# Patient Record
Sex: Female | Born: 1965
Health system: Southern US, Community
[De-identification: ages and names within clinical notes are randomized; demographics above are authoritative.]

## PROBLEM LIST (undated history)

## (undated) DIAGNOSIS — D649 Anemia, unspecified: Secondary | ICD-10-CM

## (undated) DIAGNOSIS — D573 Sickle-cell trait: Secondary | ICD-10-CM

---

## 2004-04-26 ENCOUNTER — Other Ambulatory Visit: Admission: RE | Admit: 2004-04-26 | Discharge: 2004-04-26 | Payer: Self-pay | Admitting: Family Medicine

## 2006-06-03 ENCOUNTER — Other Ambulatory Visit: Admission: RE | Admit: 2006-06-03 | Discharge: 2006-06-03 | Payer: Self-pay | Admitting: Family Medicine

## 2008-12-02 ENCOUNTER — Other Ambulatory Visit: Admission: RE | Admit: 2008-12-02 | Discharge: 2008-12-02 | Payer: Self-pay | Admitting: Family Medicine

## 2009-01-06 ENCOUNTER — Encounter: Admission: RE | Admit: 2009-01-06 | Discharge: 2009-01-06 | Payer: Self-pay | Admitting: Family Medicine

## 2010-02-06 ENCOUNTER — Encounter: Admission: RE | Admit: 2010-02-06 | Discharge: 2010-02-06 | Payer: Self-pay | Admitting: Family Medicine

## 2010-11-11 ENCOUNTER — Encounter: Payer: Self-pay | Admitting: Family Medicine

## 2012-09-29 ENCOUNTER — Other Ambulatory Visit: Payer: Self-pay | Admitting: Family Medicine

## 2012-09-29 DIAGNOSIS — Z1231 Encounter for screening mammogram for malignant neoplasm of breast: Secondary | ICD-10-CM

## 2012-11-03 ENCOUNTER — Ambulatory Visit
Admission: RE | Admit: 2012-11-03 | Discharge: 2012-11-03 | Disposition: A | Discharge status: Home / Self Care | Payer: 59 | Source: Ambulatory Visit | Attending: Family Medicine | Admitting: Family Medicine

## 2012-11-03 DIAGNOSIS — Z1231 Encounter for screening mammogram for malignant neoplasm of breast: Secondary | ICD-10-CM

## 2014-11-03 ENCOUNTER — Other Ambulatory Visit: Payer: Self-pay | Admitting: Physician Assistant

## 2014-11-03 ENCOUNTER — Other Ambulatory Visit (HOSPITAL_COMMUNITY)
Admission: RE | Admit: 2014-11-03 | Discharge: 2014-11-03 | Disposition: A | Discharge status: Home / Self Care | Payer: 59 | Source: Ambulatory Visit | Attending: Family Medicine | Admitting: Family Medicine

## 2014-11-03 DIAGNOSIS — Z01419 Encounter for gynecological examination (general) (routine) without abnormal findings: Secondary | ICD-10-CM | POA: Insufficient documentation

## 2014-11-03 DIAGNOSIS — Z1151 Encounter for screening for human papillomavirus (HPV): Secondary | ICD-10-CM | POA: Insufficient documentation

## 2014-11-05 ENCOUNTER — Emergency Department (HOSPITAL_COMMUNITY)
Admission: EM | Admit: 2014-11-05 | Discharge: 2014-11-05 | Disposition: A | Discharge status: Home / Self Care | Payer: 59 | Attending: Emergency Medicine | Admitting: Emergency Medicine

## 2014-11-05 ENCOUNTER — Encounter (HOSPITAL_COMMUNITY): Payer: Self-pay | Admitting: Emergency Medicine

## 2014-11-05 DIAGNOSIS — Z79899 Other long term (current) drug therapy: Secondary | ICD-10-CM | POA: Diagnosis not present

## 2014-11-05 DIAGNOSIS — D649 Anemia, unspecified: Secondary | ICD-10-CM | POA: Insufficient documentation

## 2014-11-05 DIAGNOSIS — R7989 Other specified abnormal findings of blood chemistry: Secondary | ICD-10-CM | POA: Diagnosis present

## 2014-11-05 HISTORY — DX: Anemia, unspecified: D64.9

## 2014-11-05 HISTORY — DX: Sickle-cell trait: D57.3

## 2014-11-05 LAB — I-STAT CHEM 8, ED
BUN: 8 mg/dL (ref 6–23)
Calcium, Ion: 1.3 mmol/L — ABNORMAL HIGH (ref 1.12–1.23)
Chloride: 105 mEq/L (ref 96–112)
Creatinine, Ser: 0.6 mg/dL (ref 0.50–1.10)
GLUCOSE: 89 mg/dL (ref 70–99)
HCT: 24 % — ABNORMAL LOW (ref 36.0–46.0)
HEMOGLOBIN: 8.2 g/dL — AB (ref 12.0–15.0)
Potassium: 3.8 mmol/L (ref 3.5–5.1)
Sodium: 141 mmol/L (ref 135–145)
TCO2: 22 mmol/L (ref 0–100)

## 2014-11-05 LAB — CBC WITH DIFFERENTIAL/PLATELET
BASOS PCT: 0 % (ref 0–1)
Basophils Absolute: 0 10*3/uL (ref 0.0–0.1)
EOS ABS: 0.1 10*3/uL (ref 0.0–0.7)
EOS PCT: 1 % (ref 0–5)
HEMATOCRIT: 23.6 % — AB (ref 36.0–46.0)
HEMOGLOBIN: 6.4 g/dL — AB (ref 12.0–15.0)
LYMPHS ABS: 2.5 10*3/uL (ref 0.7–4.0)
Lymphocytes Relative: 46 % (ref 12–46)
MCH: 16.5 pg — AB (ref 26.0–34.0)
MCHC: 27.1 g/dL — AB (ref 30.0–36.0)
MCV: 60.8 fL — AB (ref 78.0–100.0)
MONO ABS: 0.3 10*3/uL (ref 0.1–1.0)
Monocytes Relative: 6 % (ref 3–12)
NEUTROS ABS: 2.5 10*3/uL (ref 1.7–7.7)
Neutrophils Relative %: 47 % (ref 43–77)
PLATELETS: 364 10*3/uL (ref 150–400)
RBC: 3.88 MIL/uL (ref 3.87–5.11)
RDW: 20.1 % — AB (ref 11.5–15.5)
WBC: 5.4 10*3/uL (ref 4.0–10.5)

## 2014-11-05 LAB — IRON AND TIBC
IRON: 17 ug/dL — AB (ref 42–145)
Saturation Ratios: 3 % — ABNORMAL LOW (ref 20–55)
TIBC: 494 ug/dL — ABNORMAL HIGH (ref 250–470)
UIBC: 477 ug/dL — ABNORMAL HIGH (ref 125–400)

## 2014-11-05 LAB — ABO/RH: ABO/RH(D): O POS

## 2014-11-05 LAB — RETICULOCYTES
RBC.: 3.88 MIL/uL (ref 3.87–5.11)
RETIC COUNT ABSOLUTE: 38.8 10*3/uL (ref 19.0–186.0)
Retic Ct Pct: 1 % (ref 0.4–3.1)

## 2014-11-05 LAB — PREPARE RBC (CROSSMATCH)

## 2014-11-05 LAB — FERRITIN: Ferritin: 3 ng/mL — ABNORMAL LOW (ref 10–291)

## 2014-11-05 LAB — FOLATE

## 2014-11-05 LAB — VITAMIN B12: VITAMIN B 12: 573 pg/mL (ref 211–911)

## 2014-11-05 LAB — POC OCCULT BLOOD, ED: Fecal Occult Bld: NEGATIVE

## 2014-11-05 MED ORDER — SODIUM CHLORIDE 0.9 % IV SOLN
Freq: Once | INTRAVENOUS | Status: AC
  Administered 2014-11-05: 11:00:00 via INTRAVENOUS

## 2014-11-05 NOTE — ED Provider Notes (Signed)
CSN: [id number]     Arrival date & time 11/05/14  0808 History   First MD Initiated Contact with Patient 11/05/14 [id number]     Chief Complaint  Patient presents with  . Anemia  . Abnormal labs    Patient is a 49 y.o. female presenting with anemia.  Anemia   Pt has history of anemia.  She saw her doctor this week and had routine blood tests on Thursday.  Last evening she was called and was told her blood count was low.  It was 6.5.  She was told to come to the ED to get a transfusion.  She denies any complaints.  No blood in the stool.  No history of heavy menses.  No menses currently.  No shortness of breath or lightheadedness.  She is supposed to be on iron but has been taking OTC meds irregularly.  Pt has never seen a hematologist.  She has been diagnosed with iron deficiency anemia in the past by other PCPs.  The doctor she saw this week is new. Past Medical History  Diagnosis Date  . Anemia   . Sickle cell trait    History reviewed. No pertinent past surgical history. No family history on file. History  Substance Use Topics  . Smoking status: Never Smoker   . Smokeless tobacco: Not on file  . Alcohol Use: Yes     Comment: social   OB History    No data available     Review of Systems  All other systems reviewed and are negative.     Allergies  Sunflower oil  Home Medications   Prior to Admission medications   Medication Sig Start Date End Date Taking? Authorizing Provider  ferrous sulfate 325 (65 FE) MG tablet Take 325 mg by mouth daily with breakfast.   Yes Historical Provider, MD  Vitamin D, Ergocalciferol, (DRISDOL) 50000 UNITS CAPS capsule Take 50,000 Units by mouth every 7 (seven) days.   Yes Historical Provider, MD   BP 156/70 mmHg  Pulse 66  Temp(Src) 98.4 F (36.9 C) (Oral)  Resp 20  SpO2 100%  LMP 10/26/2014 Physical Exam  Constitutional: She appears well-developed and well-nourished. No distress.  HENT:  Head: Normocephalic and atraumatic.  Right  Ear: External ear normal.  Left Ear: External ear normal.  Eyes: Conjunctivae are normal. Right eye exhibits no discharge. Left eye exhibits no discharge. No scleral icterus.  Neck: Neck supple. No tracheal deviation present.  Cardiovascular: Normal rate, regular rhythm and intact distal pulses.   Pulmonary/Chest: Effort normal and breath sounds normal. No stridor. No respiratory distress. She has no wheezes. She has no rales.  Abdominal: Soft. Bowel sounds are normal. She exhibits no distension. There is no tenderness. There is no rebound and no guarding.  Musculoskeletal: She exhibits no edema or tenderness.  Neurological: She is alert. She has normal strength. No cranial nerve deficit (no facial droop, extraocular movements intact, no slurred speech) or sensory deficit. She exhibits normal muscle tone. She displays no seizure activity. Coordination normal.  Skin: Skin is warm and dry. No rash noted.  Psychiatric: She has a normal mood and affect.  Nursing note and vitals reviewed.   ED Course  Procedures (including critical care time) Labs Review Labs Reviewed  CBC WITH DIFFERENTIAL - Abnormal; Notable for the following:    Hemoglobin 6.4 (*)    HCT 23.6 (*)    MCV 60.8 (*)    MCH 16.5 (*)    MCHC 27.1 (*)  RDW 20.1 (*)    All other components within normal limits  I-STAT CHEM 8, ED - Abnormal; Notable for the following:    Calcium, Ion 1.30 (*)    Hemoglobin 8.2 (*)    HCT 24.0 (*)    All other components within normal limits  RETICULOCYTES  VITAMIN B12  FOLATE  IRON AND TIBC  FERRITIN  POC OCCULT BLOOD, ED  TYPE AND SCREEN  ABO/RH  PREPARE RBC (CROSSMATCH)    Medications  0.9 %  sodium chloride infusion ( Intravenous New Bag/Given 11/05/14 1045)     MDM   Final diagnoses:  Anemia, unspecified anemia type    The patient does not appear to have any evidence of active bleeding.  No blood in her stool. She denies any vaginal bleeding. She has a microcytic  anemia, most likely iron deficiency. The patient was given a unit of blood in the emergency department. An anemia panel was sent off for further analysis. I instructed the patient to follow up with her primary doctor to review those test results. She is on iron tablets.    Linwood Dibbles, MD 11/05/14 [phone]

## 2014-11-05 NOTE — ED Notes (Signed)
Dr.Knapp at bedside  

## 2014-11-05 NOTE — ED Notes (Addendum)
Pt sent her for low hemoglobin (6.5) she was called by her doctor Andria Meuse(Dorothy Ferguson at [facility]) yesterday to come to ED for transfusion. Pt is asymptomatic. HX of anemia and has not been taking iron as prescribed. Denies pain and denies seeing any blood.

## 2014-11-05 NOTE — ED Notes (Signed)
Donna Valdez from lab called to report a critical hemoglobin of 6.4. MD Lynelle DoctorKnapp made aware.

## 2014-11-05 NOTE — Discharge Instructions (Signed)
Anemia, Nonspecific Anemia is a condition in which the concentration of red blood cells or hemoglobin in the blood is below normal. Hemoglobin is a substance in red blood cells that carries oxygen to the tissues of the body. Anemia results in not enough oxygen reaching these tissues.  CAUSES  Common causes of anemia include:   Excessive bleeding. Bleeding may be internal or external. This includes excessive bleeding from periods (in women) or from the intestine.   Poor nutrition.   Chronic kidney, thyroid, and liver disease.  Bone marrow disorders that decrease red blood cell production.  Cancer and treatments for cancer.  HIV, AIDS, and their treatments.  Spleen problems that increase red blood cell destruction.  Blood disorders.  Excess destruction of red blood cells due to infection, medicines, and autoimmune disorders. SIGNS AND SYMPTOMS   Minor weakness.   Dizziness.   Headache.  Palpitations.   Shortness of breath, especially with exercise.   Paleness.  Cold sensitivity.  Indigestion.  Nausea.  Difficulty sleeping.  Difficulty concentrating. Symptoms may occur suddenly or they may develop slowly.  DIAGNOSIS  Additional blood tests are often needed. These help your health care provider determine the best treatment. Your health care provider will check your stool for blood and look for other causes of blood loss.  TREATMENT  Treatment varies depending on the cause of the anemia. Treatment can include:   Supplements of iron, vitamin B12, or folic acid.   Hormone medicines.   A blood transfusion. This may be needed if blood loss is severe.   Hospitalization. This may be needed if there is significant continual blood loss.   Dietary changes.  Spleen removal. HOME CARE INSTRUCTIONS Keep all follow-up appointments. It often takes many weeks to correct anemia, and having your health care provider check on your condition and your response to  treatment is very important. SEEK IMMEDIATE MEDICAL CARE IF:   You develop extreme weakness, shortness of breath, or chest pain.   You become dizzy or have trouble concentrating.  You develop heavy vaginal bleeding.   You develop a rash.   You have bloody or black, tarry stools.   You faint.   You vomit up blood.   You vomit repeatedly.   You have abdominal pain.  You have a fever or persistent symptoms for more than 2-3 days.   You have a fever and your symptoms suddenly get worse.   You are dehydrated.  MAKE SURE YOU:  Understand these instructions.  Will watch your condition.  Will get help right away if you are not doing well or get worse. Document Released: 11/14/2004 Document Revised: 06/09/2013 Document Reviewed: 04/02/2013 ExitCare Patient Information 2015 [employer]. This information is not intended to replace advice given to you by your health care provider. Make sure you discuss any questions you have with your health care provider.  

## 2014-11-06 LAB — TYPE AND SCREEN
ABO/RH(D): O POS
Antibody Screen: NEGATIVE
UNIT DIVISION: 0

## 2014-11-07 LAB — CYTOLOGY - PAP

## 2014-11-07 LAB — CERVICOVAGINAL ANCILLARY ONLY
BACTERIAL VAGINITIS: NEGATIVE
CANDIDA VAGINITIS: NEGATIVE

## 2015-05-29 ENCOUNTER — Other Ambulatory Visit: Payer: Self-pay

## 2015-05-29 DIAGNOSIS — Z1231 Encounter for screening mammogram for malignant neoplasm of breast: Secondary | ICD-10-CM

## 2015-06-01 ENCOUNTER — Ambulatory Visit
Admission: RE | Admit: 2015-06-01 | Discharge: 2015-06-01 | Disposition: A | Discharge status: Home / Self Care | Payer: 59 | Source: Ambulatory Visit

## 2015-06-01 DIAGNOSIS — Z1231 Encounter for screening mammogram for malignant neoplasm of breast: Secondary | ICD-10-CM

## 2015-12-06 ENCOUNTER — Other Ambulatory Visit: Payer: Self-pay | Admitting: Family Medicine

## 2015-12-06 ENCOUNTER — Ambulatory Visit
Admission: RE | Admit: 2015-12-06 | Discharge: 2015-12-06 | Disposition: A | Discharge status: Home / Self Care | Payer: 59 | Source: Ambulatory Visit | Attending: Family Medicine | Admitting: Family Medicine

## 2015-12-06 DIAGNOSIS — M25561 Pain in right knee: Secondary | ICD-10-CM

## 2015-12-18 ENCOUNTER — Other Ambulatory Visit: Payer: Self-pay | Admitting: Family Medicine

## 2015-12-18 DIAGNOSIS — R221 Localized swelling, mass and lump, neck: Secondary | ICD-10-CM

## 2015-12-22 ENCOUNTER — Ambulatory Visit
Admission: RE | Admit: 2015-12-22 | Discharge: 2015-12-22 | Disposition: A | Discharge status: Home / Self Care | Payer: 59 | Source: Ambulatory Visit | Attending: Family Medicine | Admitting: Family Medicine

## 2015-12-22 DIAGNOSIS — R221 Localized swelling, mass and lump, neck: Secondary | ICD-10-CM

## 2017-07-11 DIAGNOSIS — H16202 Unspecified keratoconjunctivitis, left eye: Secondary | ICD-10-CM | POA: Diagnosis not present

## 2017-07-11 DIAGNOSIS — H18212 Corneal edema secondary to contact lens, left eye: Secondary | ICD-10-CM | POA: Diagnosis not present

## 2017-08-18 ENCOUNTER — Other Ambulatory Visit: Payer: Self-pay | Admitting: Physician Assistant

## 2017-08-18 ENCOUNTER — Ambulatory Visit
Admission: RE | Admit: 2017-08-18 | Discharge: 2017-08-18 | Disposition: A | Discharge status: Home / Self Care | Payer: 59 | Source: Ambulatory Visit | Attending: Physician Assistant | Admitting: Physician Assistant

## 2017-08-18 DIAGNOSIS — Z139 Encounter for screening, unspecified: Secondary | ICD-10-CM

## 2017-08-19 ENCOUNTER — Other Ambulatory Visit: Payer: Self-pay | Admitting: Physician Assistant

## 2017-08-19 DIAGNOSIS — N63 Unspecified lump in unspecified breast: Secondary | ICD-10-CM

## 2017-09-10 ENCOUNTER — Ambulatory Visit
Admission: RE | Admit: 2017-09-10 | Discharge: 2017-09-10 | Disposition: A | Discharge status: Home / Self Care | Payer: 59 | Source: Ambulatory Visit | Attending: Physician Assistant | Admitting: Physician Assistant

## 2017-09-10 DIAGNOSIS — N6489 Other specified disorders of breast: Secondary | ICD-10-CM | POA: Diagnosis not present

## 2017-09-10 DIAGNOSIS — N63 Unspecified lump in unspecified breast: Secondary | ICD-10-CM

## 2017-09-10 DIAGNOSIS — R928 Other abnormal and inconclusive findings on diagnostic imaging of breast: Secondary | ICD-10-CM | POA: Diagnosis not present

## 2018-01-09 ENCOUNTER — Ambulatory Visit (INDEPENDENT_AMBULATORY_CARE_PROVIDER_SITE_OTHER): Payer: 59 | Admitting: Podiatry

## 2018-01-09 ENCOUNTER — Encounter: Payer: Self-pay | Admitting: Podiatry

## 2018-01-09 DIAGNOSIS — M79672 Pain in left foot: Secondary | ICD-10-CM

## 2018-01-09 DIAGNOSIS — Q828 Other specified congenital malformations of skin: Secondary | ICD-10-CM | POA: Diagnosis not present

## 2018-01-09 DIAGNOSIS — M79671 Pain in right foot: Secondary | ICD-10-CM

## 2018-01-12 NOTE — Progress Notes (Signed)
Subjective:   Patient ID: Blanche G [place], female   DOB: 52 y.o.   MRN: [id number]   HPI 52 year old female presents the office today for concerns of possible "warts" to the outside aspect of both of her feet.  She states the areas are painful with pressure in shoes.  He states that she has had them frozen 3 times and this did not help.  She denies any swelling or redness to the area but the area is painful with pressure.  She also states that she has some dry skin and causing appealing to her foot.  Denies any open sores.  She has no other concerns today.  No recent injury.   Review of Systems  All other systems reviewed and are negative.  Past Medical History:  Diagnosis Date  . Anemia   . Sickle cell trait (HCC)     History reviewed. No pertinent surgical history.   Current Outpatient Medications:  .  ferrous sulfate 325 (65 FE) MG tablet, Take 325 mg by mouth daily with breakfast., Disp: , Rfl:  .  Vitamin D, Ergocalciferol, (DRISDOL) 50000 UNITS CAPS capsule, Take 50,000 Units by mouth every 7 (seven) days., Disp: , Rfl:   Allergies  Allergen Reactions  . Sunflower Oil Itching and Swelling    Social History   Socioeconomic History  . Marital status: Single    Spouse name: Not on file  . Number of children: Not on file  . Years of education: Not on file  . Highest education level: Not on file  Occupational History  . Not on file  Social Needs  . Financial resource strain: Not on file  . Food insecurity:    Worry: Not on file    Inability: Not on file  . Transportation needs:    Medical: Not on file    Non-medical: Not on file  Tobacco Use  . Smoking status: Never Smoker  . Smokeless tobacco: Never Used  Substance and Sexual Activity  . Alcohol use: Yes    Comment: social  . Drug use: Not on file  . Sexual activity: Not on file  Lifestyle  . Physical activity:    Days per week: Not on file    Minutes per session: Not on file  . Stress: Not on file   Relationships  . Social connections:    Talks on phone: Not on file    Gets together: Not on file    Attends religious service: Not on file    Active member of club or organization: Not on file    Attends meetings of clubs or organizations: Not on file    Relationship status: Not on file  . Intimate partner violence:    Fear of current or ex partner: Not on file    Emotionally abused: Not on file    Physically abused: Not on file    Forced sexual activity: Not on file  Other Topics Concern  . Not on file  Social History Narrative  . Not on file         Objective:  Physical Exam  General: AAO x3, NAD  Dermatological: Punctate annular hyperkeratotic lesions present bilaterally.  The left foot there was 2 lesions on the lateral aspect the foot on the right side there was one.  Upon debridement these appear to be porokeratosis as opposed to the verruca.  There is no underlying ulceration, drainage or any signs of infection present.  There is dry skin present bilaterally.  Vascular:  Dorsalis Pedis artery and Posterior Tibial artery pedal pulses are 2/4 bilateral with immedate capillary fill time.  There is no pain with calf compression, swelling, warmth, erythema.   Neruologic: Grossly intact via light touch bilateral.  Protective threshold with Semmes Wienstein monofilament intact to all pedal sites bilateral.   Musculoskeletal: Tenderness the hyperkeratotic lesions.  No other areas of tenderness.  No gross boney pedal deformities bilateral. No pain, crepitus, or limitation noted with foot and ankle range of motion bilateral. Muscular strength 5/5 in all groups tested bilateral.  Gait: Unassisted, Nonantalgic.      Assessment:   Porokeratosis bilaterally     Plan:  -Treatment options discussed including all alternatives, risks, and complications -Etiology of symptoms were discussed -Lesions were sharply debrided x3 without any complications or bleeding after verbal consent  was obtained the area was cleaned with alcohol.  After debridement the areas were again cleaned and a pad was placed followed by salinocaine and a bandage.  Post procedure instructions were discussed.  Monitor for infection. Discussed likely reoccurrence.  -Discussed moisturizer to her feet daily.  Vivi BarrackMatthew R Wagoner DPM

## 2018-01-22 ENCOUNTER — Ambulatory Visit (INDEPENDENT_AMBULATORY_CARE_PROVIDER_SITE_OTHER): Payer: 59 | Admitting: Podiatry

## 2018-01-22 DIAGNOSIS — B070 Plantar wart: Secondary | ICD-10-CM

## 2018-01-22 DIAGNOSIS — Q828 Other specified congenital malformations of skin: Secondary | ICD-10-CM

## 2018-01-22 NOTE — Patient Instructions (Signed)
Take dressing off in 8 hours and wash the foot with soap and water. If it is hurting or becomes uncomfortable before the 8 hours, go ahead and remove the bandage and wash the area.  If it blisters, apply antibiotic ointment and a band-aid.  Monitor for any signs/symptoms of infection. Call the office immediately if any occur or go directly to the emergency room. Call with any questions/concerns.   

## 2018-01-27 NOTE — Progress Notes (Signed)
Subjective: Donna Valdez presents the office today for follow-up evaluation of porokeratosis bilaterally.  She states that she had some improvement after last saw her a moisturizer is not been helping the area of concern to come back.  She denies any swelling or redness or any drainage or any complications after the treatment I last saw her.  She has no new concerns. Denies any systemic complaints such as fevers, chills, nausea, vomiting. No acute changes since last appointment, and no other complaints at this time.   Objective: AAO x3, NAD DP/PT pulses palpable bilaterally, CRT less than 3 seconds Today her deep punctate annular hyperkeratotic lesions present bilaterally with the right side worse than left.  There are 2 lesions present on the lateral aspect of right foot and there is 1 lesion on the left foot.  Upon debridement there is actually mild evidence of verruca on the right foot as there is a small metabolically appear to be more of a verruca type lesion as opposed to the callus. No open lesions or pre-ulcerative lesions.  No pain with calf compression, swelling, warmth, erythema  Assessment: Verruca versus parakeratosis  Plan: -All treatment options discussed with the patient including all alternatives, risks, complications.  -Lesions are supple to be without any complications of bleeding.  On the right foot area was cleaned with alcohol and then Cantharone was applied followed by an occlusive bandage.  Post procedure instructions were discussed.  Watch for infection. -Also debrided lesions in the left foot.  Moisturizer daily. -Patient encouraged to call the office with any questions, concerns, change in symptoms.   Donna Valdez DPM

## 2018-01-30 ENCOUNTER — Ambulatory Visit: Payer: 59 | Admitting: Podiatry

## 2018-02-25 IMAGING — MG 2D DIGITAL DIAGNOSTIC BILATERAL MAMMOGRAM WITH CAD AND ADJUNCT T
8 of 15 series · 8 of 35 positions shown · non-contrast
Comparison: Mammography 06/01/2015, 11/04/2012 and earlier.

CLINICAL DATA: 51-year-old presenting with a palpable lump in the
upper outer quadrant of the left breast for approximately 1 month.
Annual evaluation, right breast.

EXAM:
2D DIGITAL DIAGNOSTIC BILATERAL MAMMOGRAM WITH CAD AND ADJUNCT TOMO
ULTRASOUND LEFT BREAST

[L MLO synth-2D (1 of 2)]
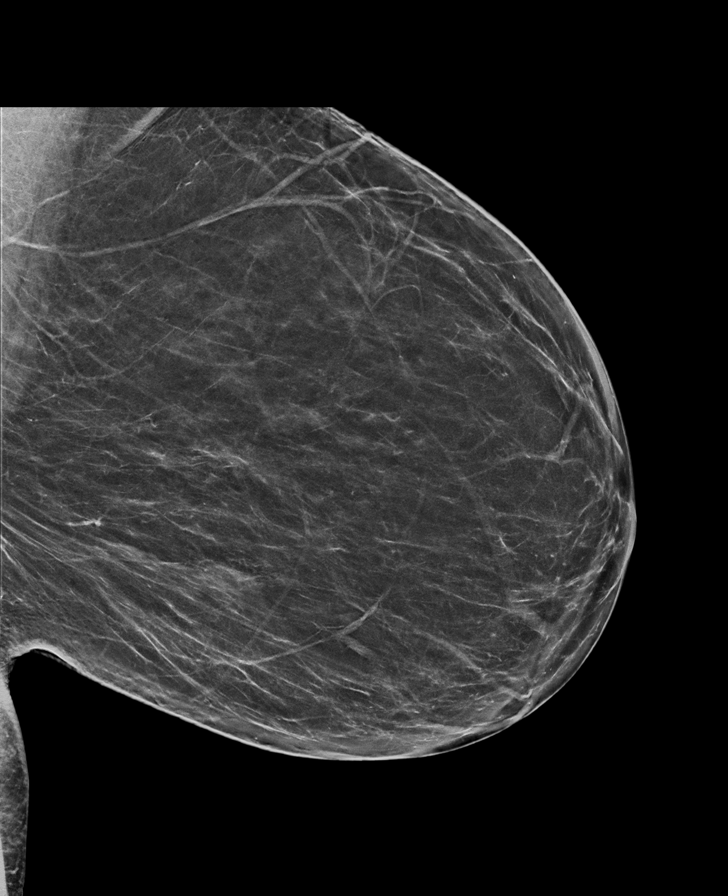

[R CC]
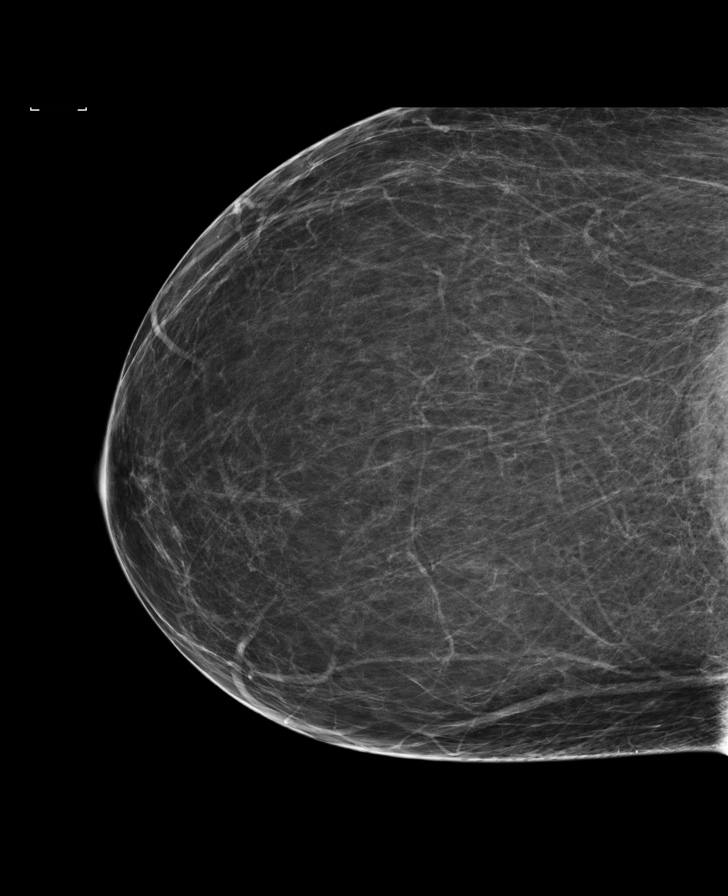

[L CC synth-2D]
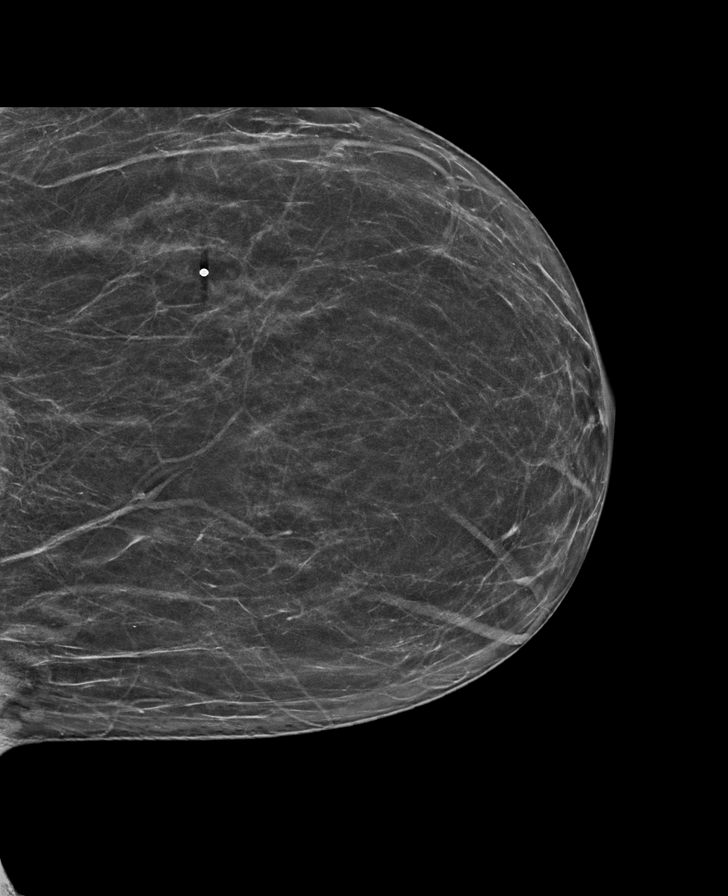

[R MLO synth-2D]
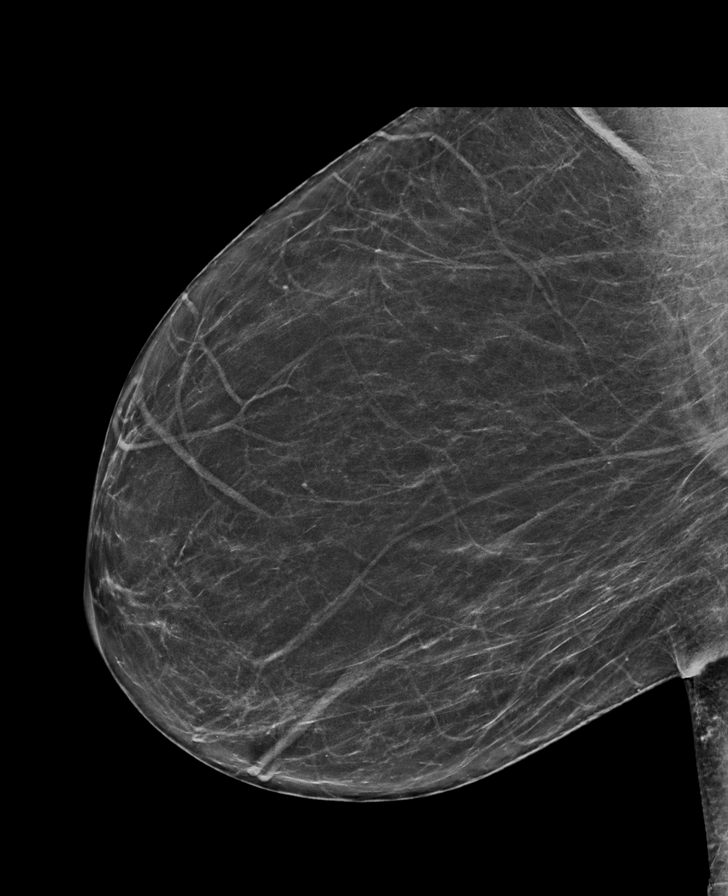

[R CC synth-2D]
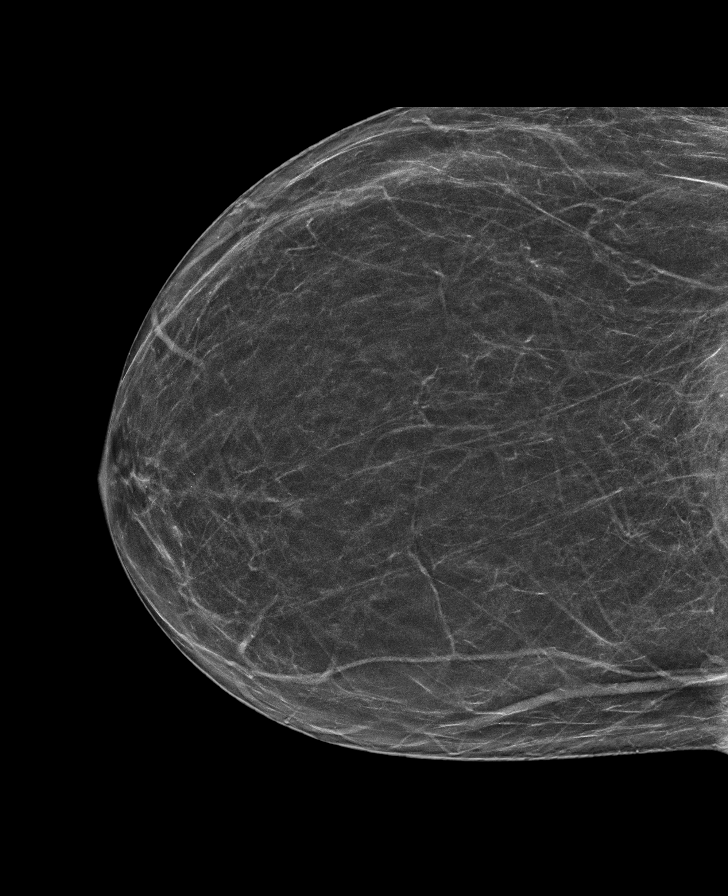

[L CC]
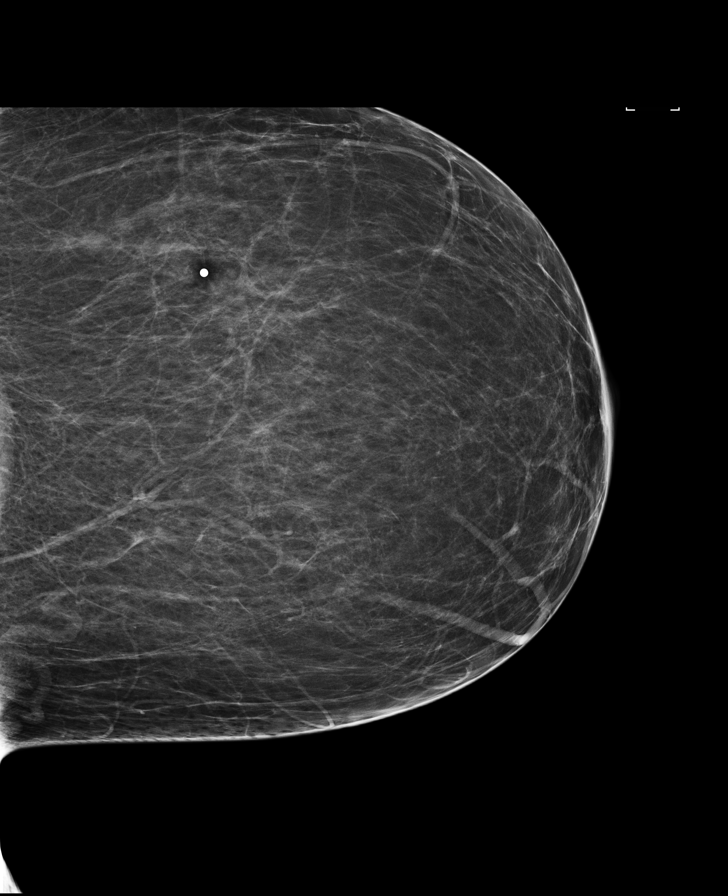

[L MLO synth-2D (2 of 2)]
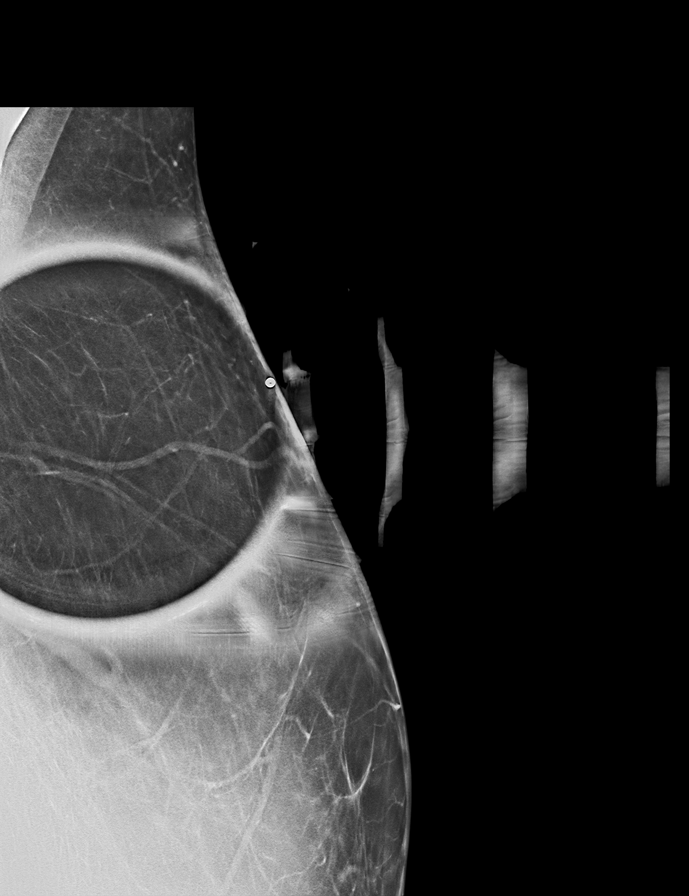

[L MLO]
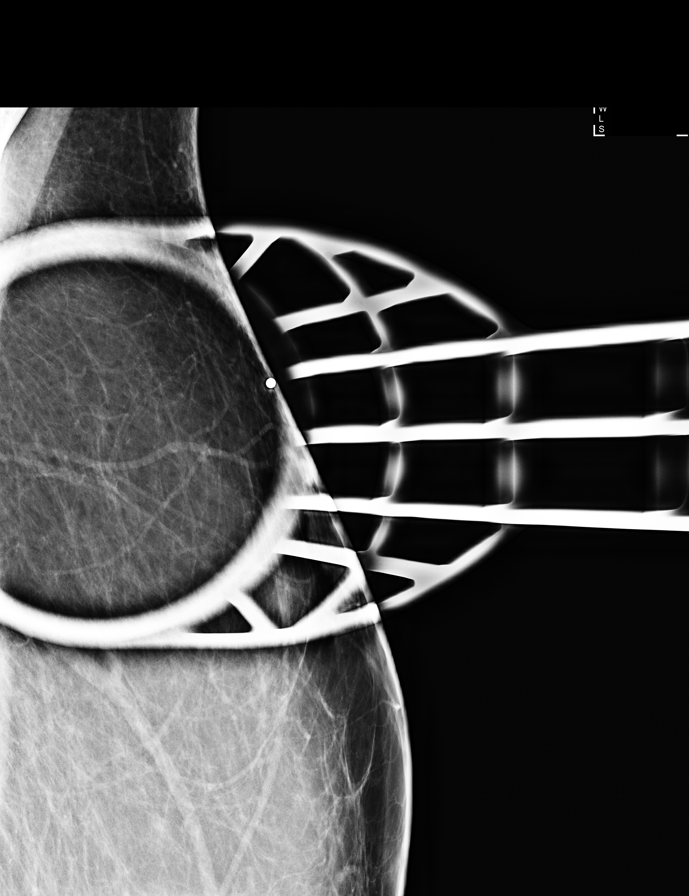

[8 of 35 positions shown; findings below may reference images not displayed]

No
prior ultrasound.

ACR Breast Density Category a: The breast tissue is almost entirely
fatty.
FINDINGS: Standard 2D and tomosynthesis full field CC and MLO views of both
breasts were obtained. A standard and tomosynthesis spot tangential
view of the area palpable concern in the left breast was also
obtained.

No mammographic abnormalities in the area of palpable concern in the
upper outer quadrant of the left breast at posterior depth.

No findings suspicious for malignancy in either breast.

Mammographic images were processed with CAD.

On physical exam, there is no palpable abnormality in the upper
outer left breast.

Targeted left breast ultrasound is performed, showing normal
fibrofatty and scattered fibroglandular tissue in the upper inner
quadrant including the 1 o'clock position approximately 15 cm from
the nipple in the area of palpable concern. No cyst, solid mass or
abnormal acoustic shadowing is identified.
IMPRESSION: 1. No mammographic or sonographic evidence of malignancy involving
the left breast.
2. No mammographic evidence of malignancy involving the right
breast.

RECOMMENDATION:
Screening mammogram in one year.(Code:[id number]-[id number])

I have discussed the findings and recommendations with the patient.
Results were also provided in writing at the conclusion of the
visit. If applicable, a reminder letter will be sent to the patient
regarding the next appointment.

BI-RADS CATEGORY  1: Negative.

## 2019-07-05 ENCOUNTER — Other Ambulatory Visit: Payer: Self-pay | Admitting: Physician Assistant

## 2019-07-05 ENCOUNTER — Other Ambulatory Visit (HOSPITAL_COMMUNITY)
Admission: RE | Admit: 2019-07-05 | Discharge: 2019-07-05 | Disposition: A | Discharge status: Home / Self Care | Payer: 59 | Source: Ambulatory Visit | Attending: Physician Assistant | Admitting: Physician Assistant

## 2019-07-05 DIAGNOSIS — Z0000 Encounter for general adult medical examination without abnormal findings: Secondary | ICD-10-CM | POA: Insufficient documentation

## 2019-07-06 LAB — CYTOLOGY - PAP
Adequacy: ABSENT
Diagnosis: NEGATIVE
HPV: NOT DETECTED

## 2020-02-18 ENCOUNTER — Other Ambulatory Visit: Payer: Self-pay | Admitting: Family Medicine

## 2020-09-28 ENCOUNTER — Other Ambulatory Visit: Payer: Self-pay

## 2020-09-28 ENCOUNTER — Ambulatory Visit (INDEPENDENT_AMBULATORY_CARE_PROVIDER_SITE_OTHER): Payer: 59 | Admitting: Podiatry

## 2020-09-28 DIAGNOSIS — L989 Disorder of the skin and subcutaneous tissue, unspecified: Secondary | ICD-10-CM | POA: Diagnosis not present

## 2020-09-28 DIAGNOSIS — M79671 Pain in right foot: Secondary | ICD-10-CM

## 2020-09-28 DIAGNOSIS — M79672 Pain in left foot: Secondary | ICD-10-CM

## 2020-09-28 MED ORDER — NONFORMULARY OR COMPOUNDED ITEM: Status: AC

## 2020-09-28 NOTE — Patient Instructions (Signed)
Keep the bandage on for 24 hours. At that time, remove and clean with soap and water. If it hurts or burns before 24 hours go ahead and remove the bandage and wash with soap and water. Keep the area clean. If there is any blistering cover with antibiotic ointment and a bandage. Monitor for any redness, drainage, or other signs of infection. Call the office if any are to occur. If you have any questions, please call the office at [phone].   I have ordered a medication for you that will come from [facility] in [place]. They should be calling you to verify insurance and will mail the medication to you. If you live close by then you can go by their pharmacy to pick up the medication. Their phone number is [phone]. If you do not hear from them in the next few days, please give us a call at [phone].   

## 2020-10-03 NOTE — Progress Notes (Signed)
Subjective: 54 year old female presents the office today for concerns of continued skin lesions on both feet. She has 2 lesions on her left foot one on the right foot still causing discomfort. She said they were doing better for some time but have come back causing discomfort. Denies any recent injury or trauma. No swelling or redness. No other concerns. Denies any systemic complaints such as fevers, chills, nausea, vomiting. No acute changes since last appointment, and no other complaints at this time.   Objective: AAO x3, NAD DP/PT pulses palpable bilaterally, CRT less than 3 seconds Punctate annular hyperkeratotic lesions present to in the left foot 1 on the right foot. On the lateral aspect of the foot. There is no ongoing ulceration drainage or signs of infection there is no foreign body. No evidence of verruca. No pain with calf compression, swelling, warmth, erythema  Assessment: Skin lesion bilaterally  Plan: -All treatment options discussed with the patient including all alternatives, risks, complications.  -She will be debrided the lesions were any complications or bleeding. Skin was cleaned with alcohol pad was placed followed by salicylic acid and a bandage. Post procedure instructions discussed. Monitor for any signs or symptoms of infection. Order compound cream today for the calluses through [facility]. Continue offloading. -Patient encouraged to call the office with any questions, concerns, change in symptoms.   Vivi Barrack DPM

## 2021-04-12 ENCOUNTER — Ambulatory Visit (INDEPENDENT_AMBULATORY_CARE_PROVIDER_SITE_OTHER): Payer: 59 | Admitting: Podiatry

## 2021-04-12 ENCOUNTER — Ambulatory Visit (INDEPENDENT_AMBULATORY_CARE_PROVIDER_SITE_OTHER): Payer: 59

## 2021-04-12 ENCOUNTER — Other Ambulatory Visit: Payer: Self-pay

## 2021-04-12 DIAGNOSIS — M76821 Posterior tibial tendinitis, right leg: Secondary | ICD-10-CM

## 2021-04-12 DIAGNOSIS — S9001XA Contusion of right ankle, initial encounter: Secondary | ICD-10-CM

## 2021-04-12 DIAGNOSIS — M7751 Other enthesopathy of right foot: Secondary | ICD-10-CM | POA: Diagnosis not present

## 2021-04-12 MED ORDER — MELOXICAM 15 MG PO TABS: 15.0000 mg | ORAL_TABLET | Freq: Every day | ORAL | 0 refills | Status: AC

## 2021-04-12 NOTE — Patient Instructions (Signed)
Posterior Tibial Tendon Tear Rehab Ask your health care provider which exercises are safe for you. Do exercises exactly as told by your health care provider and adjust them as directed. It is normal to feel mild stretching, pulling, tightness, or discomfort as you do these exercises. Stop right away if you feel sudden pain or your pain gets worse. Do not begin these exercises until told by your health care provider. Stretching and range-of-motion exercises These exercises warm up your muscles and joints and improve the movement and flexibility of your ankle. These exercises also help to relieve pain, numbness, and tingling. Gastroc stretch  Sit on the floor with your left / right leg extended. Loop a belt or towel around ball of your left / right foot. The ball of your foot is on the walking surface, right under your toes. Keep your left / right ankle and foot relaxed and keep your knee straight while you use the belt or towel to pull your foot and ankle toward you. You should feel a gentle stretch behind your calf or knee (gastrocnemius). Hold this position for __________ seconds. Repeat __________ times. Complete this exercise __________ times a day. Active ankle dorsiflexion and plantar flexion  Sit with your left / right knee straight or bent. Do not rest your foot on anything. Flex your left / right ankle to tilt the top of your foot toward your shin (dorsiflexion). Hold this position for __________ seconds. Point your toes downward to tilt the top of your foot away from your shin (plantar flexion). Hold this position for __________ seconds. Repeat __________ times with your knee straight and __________ times with your knee bent. Complete this exercise __________ times a day. Passive ankle plantar flexion  Sit with your left / right leg crossed over your opposite knee. With your left / right hand, pull the front of your foot and toes toward you (plantar flexion). You should feel a gentle  stretch on the top of your foot and ankle. Hold this position for __________ seconds. Repeat __________ times. Complete this exercise __________ times a day. Passive ankle eversion  Sit with your left / right ankle crossed over your opposite knee. With your left / right hand, hold your foot so that your thumb is on the top of your foot and your fingers are on the bottom of your foot. Gently push and twist your ankle downward (eversion) so the smallest toes rise slightly toward the ceiling. You should feel a gentle stretch on the inside of your ankle. Hold this stretch for __________ seconds. Repeat __________ times. Complete this exercise __________ times a day. Passive ankle inversion  Sit with your left / right ankle crossed over your opposite knee. With your left / right hand, hold your foot so that your thumb is on the bottom of your foot and your fingers are across the top of your foot. Gently pull and twist your foot so the smallest toe comes toward you (inversion). You should feel a gentle stretch on the outside of your ankle. Hold the stretch for __________ seconds. Repeat __________ times. Complete this exercise __________ times a day. Ankle alphabet  Sit with your left / right leg supported at the lower leg. Do not rest your foot on anything. Make sure your foot has room to move freely. Think of your left / right foot as a paintbrush, and move your foot to trace each letter of the alphabet in the air. Keep your hip and knee still while you   trace. Trace every letter of the alphabet. Repeat __________ times. Complete this exercise __________ times a day. Strengthening exercises These exercises build strength and endurance in your lower leg. Endurance is the ability to use your muscles for a long time, even after they get tired. Dorsiflexion  Secure a rubber exercise band or tube to an object that will not move if it is pulled on, such as a table leg. Secure the other end of the  band around your left / right foot. Sit on the floor, facing the object with your left / right leg extended. The band or tube should be slightly tense when your foot is relaxed. Slowly flex your left / right ankle and toes to bring your foot toward you (dorsiflexion). Hold this position for __________ seconds. Let the band or tube slowly pull your foot back to the starting position. Repeat __________ times. Complete this exercise __________ times a day. Plantar flexion while seated  Sit on the floor with your left / right leg extended. Loop a rubber exercise band or tube around the ball of your left / right foot. The ball of your foot is on the walking surface, right under your toes. The band or tube should be slightly tense when your foot is relaxed. Slowly point your toes downward, pushing them away from you (plantar flexion). Hold this position for __________ seconds. Let the band or tube slowly pull your foot back to the starting position. Repeat __________ times. Complete this exercise __________ times a day. Towel curls  Sit in a chair on a non-carpeted surface, and put your feet on the floor. Place a towel in front of your feet. If told by your health care provider, add __________ to the end of the towel. Keeping your heel on the floor, put your left / right foot on the towel. Pull the towel toward you by grabbing the towel with your toes and curling them under. Keep your heel on the floor. Repeat __________ times. Complete this exercise __________ times a day. This information is not intended to replace advice given to you by your health care provider. Make sure you discuss any questions you have with your health care provider. Document Revised: 01/29/2019 Document Reviewed: 11/25/2018 Elsevier Patient Education  2022 [employer]  

## 2021-04-13 ENCOUNTER — Telehealth: Payer: Self-pay | Admitting: Podiatry

## 2021-04-13 ENCOUNTER — Encounter: Payer: Self-pay | Admitting: Podiatry

## 2021-04-13 NOTE — Telephone Encounter (Signed)
Patient is requesting a Dr. Phoebe Sharps for Saturday (tomorrow) due to the current pain in her right foot.

## 2021-04-16 ENCOUNTER — Encounter: Payer: Self-pay | Admitting: Podiatry

## 2021-04-16 ENCOUNTER — Telehealth: Payer: Self-pay | Admitting: Podiatry

## 2021-04-16 NOTE — Telephone Encounter (Signed)
Pt called asking for the code for the brace that Dr Ardelle Anton was going to give the pt last Thursday at her visit. She told the gentleman that was going to put it on that she wanted to check with insurance first before accepting it. Pt said it was 175.00.  I discussed with Kendal Hymen and she said [phone] and that is what I gave the pt.

## 2021-04-19 NOTE — Progress Notes (Signed)
Subjective: 55 year old female presents the office today for concerns of discomfort on the medial aspect of her ankle which is been on for the last 2 to 3 months.  She describes becoming more consistent of an aching sensation and hurts to apply pressure with walking.  No injury that she reports.  No some mild swelling.  No redness or warmth.  No recent treatment. Denies any systemic complaints such as fevers, chills, nausea, vomiting. No acute changes since last appointment, and no other complaints at this time.   Objective: AAO x3, NAD DP/PT pulses palpable bilaterally, CRT less than 3 seconds There is tenderness palpation along the distal portion of the posterior tibial tendon inferior to the medial malleolus on the insertion into the navicular tuberosity.  There is mild edema to this area compared to the contralateral extremity.  No erythema or warmth.  Overall the tendon appears to be intact without any complete rupture noted.  MMT 5/5 No pain with calf compression, swelling, warmth, erythema  Assessment: Posterior tibial tendinitis, dysfunction  Plan: -All treatment options discussed with the patient including all alternatives, risks, complications.  -X-rays obtained reviewed.  No evidence of acute fracture identified. -Prescription meloxicam discussed side effects of medication.  Also dispensed a Tri-Lock ankle brace for immobilization.  Ice daily.  As she starts to feel better discussed stretching, rehab exercises that she can start to do and gradually increase this.  Also discussed shoes and good arch supports.  If symptoms continue MRI. -Patient encouraged to call the office with any questions, concerns, change in symptoms.   Vivi Barrack DPM

## 2021-05-10 ENCOUNTER — Ambulatory Visit (INDEPENDENT_AMBULATORY_CARE_PROVIDER_SITE_OTHER): Payer: 59 | Admitting: Podiatry

## 2021-05-10 ENCOUNTER — Other Ambulatory Visit: Payer: Self-pay

## 2021-05-10 DIAGNOSIS — M76821 Posterior tibial tendinitis, right leg: Secondary | ICD-10-CM | POA: Diagnosis not present

## 2021-05-10 MED ORDER — METHYLPREDNISOLONE 4 MG PO TBPK: ORAL_TABLET | ORAL | 0 refills | Status: DC

## 2021-05-15 NOTE — Progress Notes (Signed)
Subjective: 55 year old female presents the office today for follow evaluation of right ankle pain.  She states that she is doing the same not seeing any improvement.  She states that the brace that she wears is mostly at nighttime and not during the day.  No recent injury or falls or changes otherwise.  Patient denies any systemic complaints such as fevers, chills, nausea, vomiting. No acute changes since last appointment, and no other complaints at this time.   Objective: AAO x3, NAD DP/PT pulses palpable bilaterally, CRT less than 3 seconds There are still tenderness palpation on distal portion of posterior tibial tendon inferior to the medial malleolus and towards the insertion to the navicular tuberosity.  Difficulty with single heel rise.  There is still edema although mild.  No erythema or warmth.  No open lesions or pre-ulcerative lesions.  No pain with calf compression, swelling, warmth, erythema  Assessment: Posterior tibial tendon dysfunction, rule out tear  Plan: -All treatment options discussed with the patient including all alternatives, risks, complications.  -At this point she not had any significant improvement discussed MRI and she agrees to proceed with this.  This was ordered.  Discussed transition to cam boot.  However after discussion she is on a daily ankle brace and discussed wearing this to the day when she is up and walking and not at nighttime.  Continue ice elevate as well. -Patient encouraged to call the office with any questions, concerns, change in symptoms.    Vivi Barrack DPM

## 2021-06-10 ENCOUNTER — Other Ambulatory Visit: Payer: Self-pay

## 2021-06-10 ENCOUNTER — Ambulatory Visit
Admission: RE | Admit: 2021-06-10 | Discharge: 2021-06-10 | Disposition: A | Discharge status: Home / Self Care | Payer: 59 | Source: Ambulatory Visit | Attending: Podiatry | Admitting: Podiatry

## 2021-06-10 DIAGNOSIS — M76821 Posterior tibial tendinitis, right leg: Secondary | ICD-10-CM

## 2021-12-17 ENCOUNTER — Ambulatory Visit (INDEPENDENT_AMBULATORY_CARE_PROVIDER_SITE_OTHER): Payer: 59 | Admitting: Podiatrist

## 2021-12-17 ENCOUNTER — Other Ambulatory Visit: Payer: Self-pay

## 2021-12-17 ENCOUNTER — Encounter: Payer: Self-pay | Admitting: Podiatrist

## 2021-12-17 DIAGNOSIS — L989 Disorder of the skin and subcutaneous tissue, unspecified: Secondary | ICD-10-CM

## 2021-12-17 NOTE — Patient Instructions (Signed)
Corns and Calluses Corns are small areas of thickened skin that form on the top, sides, or tip of a toe. Corns have a cone-shaped core with a point that can press on a nerve below. This causes pain. Calluses are areas of thickened skin that can form anywhere on the body, including the hands, fingers, palms, soles of the feet, and heels. Calluses are usually larger than corns. What are the causes? Corns and calluses are caused by rubbing (friction) or pressure, such as from shoes that are too tight or do not fit properly. What increases the risk? Corns are more likely to develop in people who have misshapen toes (toe deformities), such as hammer toes. Calluses can form with friction to any area of the skin. They are more likely to develop in people who: Work with their hands. Wear shoes that fit poorly, are too tight, or are high-heeled. Have toe deformities. What are the signs or symptoms? Symptoms of a corn or callus include: A hard growth on the skin. Pain or tenderness under the skin. Redness and swelling. Increased discomfort while wearing tight-fitting shoes, if your feet are affected. If a corn or callus becomes infected, symptoms may include: Redness and swelling that gets worse. Pain. Fluid, blood, or pus draining from the corn or callus. How is this diagnosed? Corns and calluses may be diagnosed based on your symptoms, your medical history, and a physical exam. How is this treated? Treatment for corns and calluses may include: Removing the cause of the friction or pressure. This may involve: Changing your shoes. Wearing shoe inserts (orthotics) or other protective layers in your shoes, such as a corn pad. Wearing gloves. Applying medicine to the skin (topical medicine) to help soften skin in the hardened, thickened areas. Removing layers of dead skin with a file to reduce the size of the corn or callus. Removing the corn or callus with a scalpel or laser. Taking antibiotic  medicines, if your corn or callus is infected. Having surgery, if a toe deformity is the cause. Follow these instructions at home:  Take over-the-counter and prescription medicines only as told by your health care provider. If you were prescribed an antibiotic medicine, take it as told by your health care provider. Do not stop taking it even if your condition improves. Wear shoes that fit well. Avoid wearing high-heeled shoes and shoes that are too tight or too loose. Wear any padding, protective layers, gloves, or orthotics as told by your health care provider. Soak your hands or feet. Then use a file or pumice stone to soften your corn or callus. Do this as told by your health care provider. Check your corn or callus every day for signs of infection. Contact a health care provider if: Your symptoms do not improve with treatment. You have redness or swelling that gets worse. Your corn or callus becomes painful. You have fluid, blood, or pus coming from your corn or callus. You have new symptoms. Get help right away if: You develop severe pain with redness. Summary Corns are small areas of thickened skin that form on the top, sides, or tip of a toe. These can be painful. Calluses are areas of thickened skin that can form anywhere on the body, including the hands, fingers, palms, and soles of the feet. Calluses are usually larger than corns. Corns and calluses are caused by rubbing (friction) or pressure, such as from shoes that are too tight or do not fit properly. Treatment may include wearing padding, protective   layers, gloves, or orthotics as told by your health care provider. This information is not intended to replace advice given to you by your health care provider. Make sure you discuss any questions you have with your health care provider. Document Revised: 02/03/2020 Document Reviewed: 02/03/2020 Elsevier Patient Education  2022 Elsevier Inc.  

## 2021-12-17 NOTE — Progress Notes (Signed)
Chief Complaint  Patient presents with   Callouses    Bilateral hard places on bottom of feet      HPI: Patient is 56 y.o. female who presents today for painful lesions on bilateral feet.  She states she has hard places on the bottom of her feet that have been hurting with walking.  She denies any self treatment.   Allergies  Allergen Reactions   Sunflower Oil Itching and Swelling    Review of systems is negative except as noted in the HPI.  Denies nausea/ vomiting/ fevers/ chills or night sweats.   Denies difficulty breathing, denies calf pain or tenderness  Physical Exam  Patient is awake, alert, and oriented x 3.  In no acute distress.    Vascular status is intact with palpable pedal pulses DP and PT bilateral and capillary refill time less than 3 seconds bilateral.  No edema or erythema noted.   Neurological exam reveals epicritic and protective sensation grossly intact bilateral.   Dermatological exam reveals skin is supple and dry to bilateral feet.  Well-circumscribed porokeratotic lesions are noted submetatarsal 5 base bilateral.  Pain with direct pressure is noted.  Skin tension lines are present throughout.  Musculoskeletal exam: Musculature intact with dorsiflexion, plantarflexion, inversion, eversion.    Assessment:   ICD-10-CM   1. Benign skin lesion  L98.9        Plan: Discussed exam findings and treatment options and alternatives.  Recommended paring the lesions with a 15 blade which was accomplished today without complication I applied Canthacur to the lesions as well and gave instructions for aftercare.  She will wash off this medication tonight or tomorrow and will call with any concerns arise.  She will be seen back as needed for follow-up of this condition.

## 2021-12-19 ENCOUNTER — Encounter: Payer: Self-pay | Admitting: Podiatrist

## 2022-01-17 ENCOUNTER — Ambulatory Visit (INDEPENDENT_AMBULATORY_CARE_PROVIDER_SITE_OTHER): Payer: 59 | Admitting: Podiatry

## 2022-01-17 ENCOUNTER — Encounter: Payer: Self-pay | Admitting: Podiatry

## 2022-01-17 ENCOUNTER — Telehealth: Payer: Self-pay | Admitting: Podiatry

## 2022-01-17 DIAGNOSIS — N3281 Overactive bladder: Secondary | ICD-10-CM | POA: Insufficient documentation

## 2022-01-17 DIAGNOSIS — Q828 Other specified congenital malformations of skin: Secondary | ICD-10-CM | POA: Diagnosis not present

## 2022-01-17 DIAGNOSIS — D573 Sickle-cell trait: Secondary | ICD-10-CM | POA: Insufficient documentation

## 2022-01-17 DIAGNOSIS — I10 Essential (primary) hypertension: Secondary | ICD-10-CM | POA: Insufficient documentation

## 2022-01-17 DIAGNOSIS — E785 Hyperlipidemia, unspecified: Secondary | ICD-10-CM | POA: Insufficient documentation

## 2022-01-17 DIAGNOSIS — M76821 Posterior tibial tendinitis, right leg: Secondary | ICD-10-CM | POA: Diagnosis not present

## 2022-01-17 DIAGNOSIS — L989 Disorder of the skin and subcutaneous tissue, unspecified: Secondary | ICD-10-CM

## 2022-01-17 DIAGNOSIS — D649 Anemia, unspecified: Secondary | ICD-10-CM | POA: Insufficient documentation

## 2022-01-17 DIAGNOSIS — E559 Vitamin D deficiency, unspecified: Secondary | ICD-10-CM | POA: Insufficient documentation

## 2022-01-17 DIAGNOSIS — D509 Iron deficiency anemia, unspecified: Secondary | ICD-10-CM | POA: Insufficient documentation

## 2022-01-17 DIAGNOSIS — Z780 Asymptomatic menopausal state: Secondary | ICD-10-CM | POA: Insufficient documentation

## 2022-01-17 NOTE — Progress Notes (Signed)
?  Subjective:  ?Patient ID: Donna Valdez, female    DOB: [birth year],   MRN: [id number] ? ?Chief Complaint  ?Patient presents with  ? Foot Pain  ?  The right ankle hurts if I try to turn it a certain way and I have had mri and an x-ray done  ? Plantar Warts  ?  I have some spots on the balls of both feet and I saw another doctor and they did not scrape it good enough and still hurts and I do not know what to do  ? ? ?56 y.o. female presents for concern of continued right ankle pain and lesion on the bottom of both feet. Relates the ankle is doing about the same. Has only been wearing brace and night and not doing much else for the pain. Requesting to have an injection today. Also relates the areas on the bottom of her feet need trimming.  . Denies any other pedal complaints. Denies n/v/f/c.  ? ?Past Medical History:  ?Diagnosis Date  ? Anemia   ? Sickle cell trait (HCC)   ? ? ?Objective:  ?Physical Exam: ?Vascular: DP/PT pulses 2/4 bilateral. CFT <3 seconds. Normal hair growth on digits. No edema.  ?Skin. No lacerations or abrasions bilateral feet. Hyperkeratotic cored lesion noted sub left first metatarsal and base of fifth metatarsal bilateral.  ?Musculoskeletal: MMT 5/5 bilateral lower extremities in DF, PF, Inversion and Eversion. Deceased ROM in DF of ankle joint. Tender to PT tendon just proximal to insertion site. Pain with inversion of the foot.  ?Neurological: Sensation intact to light touch.  ? ?Assessment:  ? ?1. Posterior tibial tendon dysfunction (PTTD) of right lower extremity   ?2. Benign skin lesion   ?3. Porokeratosis   ? ? ? ?Plan:  ?Patient was evaluated and treated and all questions answered. ?X-rays reviewed from prior ?Discussed PTTD diagnosis and treatment options with patient. ?Continue stretching exercises ?Continue with anti-inflammatories  ?Make sure to wear brace during the day and not at night.  ?Requesting injection today. Procedure below.  ?Discussed if there is no improvement  PT/MRI/injection may be an option. ?-Discussed corns and calluses with patient and treatment options.  ?-Hyperkeratotic tissue was debrided with chisel without incident.  ?-Applied salycylic acid treatment to area with dressing. Advised to remove bandaging tomorrow.  ?-Encouraged daily moisturizing ?-Discussed use of pumice stone ?-Advised good supportive shoes and inserts ?-Patient to return to office as needed or sooner if condition worsens. ? ? ?Procedure: Injection Tendon/Ligament ?Discussed alternatives, risks, complications and verbal consent was obtained.  ?Location: Right Posterior tibial tendon . ?Skin Prep: Alcohol. ?Injectate: 1cc 0.5% marcaine plain, 1 cc dexamethasone.  ?Disposition: Patient tolerated procedure well. Injection site dressed with a band-aid.  ?Post-injection care was discussed and return precautions discussed.  ? ? ? ? ? ?Louann Sjogren, DPM  ? ? ?

## 2022-06-13 ENCOUNTER — Ambulatory Visit (INDEPENDENT_AMBULATORY_CARE_PROVIDER_SITE_OTHER): Payer: 59 | Admitting: Podiatry

## 2022-06-13 ENCOUNTER — Encounter: Payer: Self-pay | Admitting: Podiatry

## 2022-06-13 DIAGNOSIS — L989 Disorder of the skin and subcutaneous tissue, unspecified: Secondary | ICD-10-CM | POA: Diagnosis not present

## 2022-06-13 DIAGNOSIS — Q828 Other specified congenital malformations of skin: Secondary | ICD-10-CM

## 2022-06-13 DIAGNOSIS — M76821 Posterior tibial tendinitis, right leg: Secondary | ICD-10-CM

## 2022-06-13 MED ORDER — METHYLPREDNISOLONE 4 MG PO TBPK: ORAL_TABLET | ORAL | 0 refills | Status: AC

## 2022-06-13 NOTE — Progress Notes (Signed)
  Subjective:  Patient ID: Quinlee G [place], female    DOB: 11-18-1965,   MRN: [id number]  Chief Complaint  Patient presents with   Plantar Warts    bil warts and Bil ankle pain    56 y.o. female presents for concern of continued right ankle pain and lesion on the bottom of both feet. Relates the ankle is doing about the same. Has only been wearing brace when in pain and not regularly. Thalia Bloodgood about other medicines to get rid of the pain. Marland Kitchen Also relates the areas on the bottom of her feet need trimming.  . Denies any other pedal complaints. Denies n/v/f/c.   Past Medical History:  Diagnosis Date   Anemia    Sickle cell trait (HCC)     Objective:  Physical Exam: Vascular: DP/PT pulses 2/4 bilateral. CFT <3 seconds. Normal hair growth on digits. No edema.  Skin. No lacerations or abrasions bilateral feet. Hyperkeratotic cored lesion noted sub left first metatarsal and base of fifth metatarsal bilateral.  Musculoskeletal: MMT 5/5 bilateral lower extremities in DF, PF, Inversion and Eversion. Deceased ROM in DF of ankle joint. Tender to PT tendon just proximal to insertion site. Pain with inversion of the foot.  Neurological: Sensation intact to light touch.   Assessment:   1. Porokeratosis   2. Posterior tibial tendon dysfunction (PTTD) of right lower extremity   3. Benign skin lesion       Plan:  Patient was evaluated and treated and all questions answered. X-rays reviewed from prior Discussed PTTD diagnosis and treatment options with patient. Continue stretching exercises Continue with anti-inflammatories  Medrol dose pack provided.  Make sure to wear brace during the day and not at night.  Defers injection today.  Discussed possible CMO. Patient will consider.  Discussed if there is no improvement PT/MRI/injection may be an option. -Discussed corns and calluses with patient and treatment options.  -Hyperkeratotic tissue was debrided with chisel without incident.   -Applied salycylic acid treatment to area with dressing. Advised to remove bandaging tomorrow.  -Encouraged daily moisturizing -Discussed use of pumice stone -Advised good supportive shoes and inserts -Patient to return to office as needed or sooner if condition worsens.        Louann Sjogren, DPM

## 2022-08-26 ENCOUNTER — Other Ambulatory Visit: Payer: Self-pay | Admitting: Family Medicine

## 2022-08-26 DIAGNOSIS — Z1231 Encounter for screening mammogram for malignant neoplasm of breast: Secondary | ICD-10-CM

## 2022-09-03 ENCOUNTER — Ambulatory Visit
Admission: RE | Admit: 2022-09-03 | Discharge: 2022-09-03 | Disposition: A | Discharge status: Home / Self Care | Payer: 59 | Source: Ambulatory Visit | Attending: Family Medicine | Admitting: Family Medicine

## 2022-09-03 DIAGNOSIS — Z1231 Encounter for screening mammogram for malignant neoplasm of breast: Secondary | ICD-10-CM

## 2022-10-01 ENCOUNTER — Ambulatory Visit (INDEPENDENT_AMBULATORY_CARE_PROVIDER_SITE_OTHER): Payer: 59 | Admitting: Podiatry

## 2022-10-01 DIAGNOSIS — B070 Plantar wart: Secondary | ICD-10-CM | POA: Diagnosis not present

## 2022-10-01 DIAGNOSIS — M76821 Posterior tibial tendinitis, right leg: Secondary | ICD-10-CM | POA: Diagnosis not present

## 2022-10-01 NOTE — Patient Instructions (Signed)
Take dressing off in 8 hours and wash the foot with soap and water. If it is hurting or becomes uncomfortable before the 8 hours, go ahead and remove the bandage and wash the area.  If it blisters, apply antibiotic ointment and a band-aid.  Monitor for any signs/symptoms of infection. Call the office immediately if any occur or go directly to the emergency room. Call with any questions/concerns.   

## 2022-10-08 NOTE — Progress Notes (Signed)
Subjective: Chief Complaint  Patient presents with   Callouses   56 year old female presents the office for above concerns.  She has recently noticed a painful lesion involving her feet.  She also gets pain along the ankle and she has been having ongoing issues with posterior tibial tendon dysfunction.  No recent injury or changes.  She was last seen in August with Dr. Ralene Cork for this issue as well.  Objective: AAO x3, NAD DP/PT pulses palpable bilaterally, CRT less than 3 seconds Hyperkeratotic lesion present submetatarsal left first MPJ as well as bilateral fifth metatarsal heads. Ulceration or signs of infection.  Decreased medial arch upon weightbearing there is continuation tenderness palpation on the posterior tibial tendon.  There is no area of pinpoint tenderness. No pain with calf compression, swelling, warmth, erythema  Assessment: Skin lesion, posterior tibial tendon dysfunction  Plan: -All treatment options discussed with the patient including all alternatives, risks, complications.  -Instructed to be skin lesion with any complications or bleeding.  Skin skin with alcohol and Cantharone Plus was applied followed by an occlusive bandage.  Postprocedure instructions discussed.  Monitor for any signs or symptoms of infection. -In regards to her flatfoot, posterior tibial tendon dysfunction we discussed surgical versus conservative care.  1 continue with conservative care for now.  Discussed using good arch support.  She was measured for orthotics today.  Consider physical therapy. -Patient encouraged to call the office with any questions, concerns, change in symptoms.   Vivi Barrack DPM

## 2022-11-13 ENCOUNTER — Other Ambulatory Visit: Payer: 59

## 2022-11-17 NOTE — Progress Notes (Signed)
Patient presents today to pick up custom molded foot orthotics recommended by Dr. Jacqualyn Posey.   Orthotics were dispensed and fit was satisfactory. Reviewed instructions for break-in and wear. Written instructions given to patient.  Patient will follow up as needed.   [facility] - order # [id number]

## 2022-11-18 ENCOUNTER — Ambulatory Visit (INDEPENDENT_AMBULATORY_CARE_PROVIDER_SITE_OTHER): Payer: 59

## 2022-11-18 DIAGNOSIS — B070 Plantar wart: Secondary | ICD-10-CM

## 2022-11-18 DIAGNOSIS — M76821 Posterior tibial tendinitis, right leg: Secondary | ICD-10-CM

## 2024-08-05 ENCOUNTER — Other Ambulatory Visit: Payer: Self-pay | Admitting: Family Medicine

## 2024-08-05 DIAGNOSIS — Z1231 Encounter for screening mammogram for malignant neoplasm of breast: Secondary | ICD-10-CM

## 2024-08-20 ENCOUNTER — Ambulatory Visit
Admission: RE | Admit: 2024-08-20 | Discharge: 2024-08-20 | Disposition: A | Discharge status: Home / Self Care | Source: Ambulatory Visit | Attending: Family Medicine | Admitting: Family Medicine

## 2024-08-20 DIAGNOSIS — Z1231 Encounter for screening mammogram for malignant neoplasm of breast: Secondary | ICD-10-CM
